# Patient Record
Sex: Male | Born: 1950 | Race: White | Hispanic: No | Marital: Married | Smoking: Never smoker
Health system: Southern US, Community
[De-identification: ages and names within clinical notes are randomized; demographics above are authoritative.]

## PROBLEM LIST (undated history)

## (undated) DIAGNOSIS — G43909 Migraine, unspecified, not intractable, without status migrainosus: Secondary | ICD-10-CM

## (undated) DIAGNOSIS — H409 Unspecified glaucoma: Secondary | ICD-10-CM

## (undated) DIAGNOSIS — R55 Syncope and collapse: Principal | ICD-10-CM

## (undated) DIAGNOSIS — G459 Transient cerebral ischemic attack, unspecified: Secondary | ICD-10-CM

## (undated) HISTORY — DX: Migraine, unspecified, not intractable, without status migrainosus: G43.909

## (undated) HISTORY — DX: Syncope and collapse: R55

---

## 1982-12-23 HISTORY — PX: APPENDECTOMY: SHX54

## 2004-08-09 ENCOUNTER — Ambulatory Visit (HOSPITAL_COMMUNITY): Admission: RE | Admit: 2004-08-09 | Discharge: 2004-08-09 | Payer: Self-pay | Admitting: Gastroenterology

## 2004-08-09 ENCOUNTER — Encounter (INDEPENDENT_AMBULATORY_CARE_PROVIDER_SITE_OTHER): Payer: Self-pay | Admitting: Specialist

## 2006-08-12 ENCOUNTER — Ambulatory Visit (HOSPITAL_COMMUNITY): Admission: RE | Admit: 2006-08-12 | Discharge: 2006-08-12 | Payer: Self-pay | Admitting: General Surgery

## 2006-12-23 HISTORY — PX: WRIST SURGERY: SHX841

## 2008-02-17 ENCOUNTER — Encounter (INDEPENDENT_AMBULATORY_CARE_PROVIDER_SITE_OTHER): Payer: Self-pay | Admitting: Orthopedic Surgery

## 2008-02-17 ENCOUNTER — Ambulatory Visit (HOSPITAL_BASED_OUTPATIENT_CLINIC_OR_DEPARTMENT_OTHER): Admission: RE | Admit: 2008-02-17 | Discharge: 2008-02-17 | Payer: Self-pay | Admitting: Orthopedic Surgery

## 2011-03-04 ENCOUNTER — Emergency Department (HOSPITAL_COMMUNITY): Payer: 59

## 2011-03-04 ENCOUNTER — Emergency Department (HOSPITAL_COMMUNITY)
Admission: EM | Admit: 2011-03-04 | Discharge: 2011-03-05 | Disposition: A | Discharge status: Home / Self Care | Payer: 59 | Attending: Emergency Medicine | Admitting: Emergency Medicine

## 2011-03-04 DIAGNOSIS — R51 Headache: Secondary | ICD-10-CM | POA: Insufficient documentation

## 2011-03-04 DIAGNOSIS — G459 Transient cerebral ischemic attack, unspecified: Secondary | ICD-10-CM

## 2011-03-04 DIAGNOSIS — F29 Unspecified psychosis not due to a substance or known physiological condition: Secondary | ICD-10-CM | POA: Insufficient documentation

## 2011-03-04 DIAGNOSIS — H409 Unspecified glaucoma: Secondary | ICD-10-CM | POA: Insufficient documentation

## 2011-03-04 DIAGNOSIS — R4701 Aphasia: Secondary | ICD-10-CM | POA: Insufficient documentation

## 2011-03-04 DIAGNOSIS — I059 Rheumatic mitral valve disease, unspecified: Secondary | ICD-10-CM | POA: Insufficient documentation

## 2011-03-04 DIAGNOSIS — R079 Chest pain, unspecified: Secondary | ICD-10-CM | POA: Insufficient documentation

## 2011-03-04 DIAGNOSIS — G9389 Other specified disorders of brain: Secondary | ICD-10-CM | POA: Insufficient documentation

## 2011-03-04 DIAGNOSIS — I079 Rheumatic tricuspid valve disease, unspecified: Secondary | ICD-10-CM | POA: Insufficient documentation

## 2011-03-04 LAB — DIFFERENTIAL
Basophils Absolute: 0 10*3/uL (ref 0.0–0.1)
Basophils Relative: 0 % (ref 0–1)
Eosinophils Relative: 3 % (ref 0–5)
Lymphocytes Relative: 19 % (ref 12–46)
Neutro Abs: 3.2 10*3/uL (ref 1.7–7.7)

## 2011-03-04 LAB — URINALYSIS, ROUTINE W REFLEX MICROSCOPIC
Glucose, UA: NEGATIVE mg/dL
Ketones, ur: NEGATIVE mg/dL
Nitrite: NEGATIVE
Protein, ur: NEGATIVE mg/dL
Urobilinogen, UA: 0.2 mg/dL (ref 0.0–1.0)

## 2011-03-04 LAB — CBC
HCT: 42.9 % (ref 39.0–52.0)
Hemoglobin: 15.2 g/dL (ref 13.0–17.0)
RDW: 13 % (ref 11.5–15.5)
WBC: 5.1 10*3/uL (ref 4.0–10.5)

## 2011-03-04 LAB — CK TOTAL AND CKMB (NOT AT ARMC)
CK, MB: 3.3 ng/mL (ref 0.3–4.0)
Relative Index: 2 (ref 0.0–2.5)
Total CK: 164 U/L (ref 7–232)

## 2011-03-04 LAB — RAPID URINE DRUG SCREEN, HOSP PERFORMED: Barbiturates: NOT DETECTED

## 2011-03-04 LAB — COMPREHENSIVE METABOLIC PANEL
ALT: 37 U/L (ref 0–53)
Calcium: 8.7 mg/dL (ref 8.4–10.5)
Creatinine, Ser: 0.89 mg/dL (ref 0.4–1.5)
GFR calc non Af Amer: >60 mL/min (ref >60)
Glucose, Bld: 113 mg/dL — ABNORMAL HIGH (ref 70–99)
Sodium: 137 mEq/L (ref 135–145)
Total Protein: 6.6 g/dL (ref 6.0–8.3)

## 2011-03-04 LAB — PROTIME-INR
INR: 1 (ref 0.00–1.49)
Prothrombin Time: 13.4 seconds (ref 11.6–15.2)

## 2011-03-04 LAB — POCT CARDIAC MARKERS
CKMB, poc: 2.1 ng/mL (ref 1.0–8.0)
Myoglobin, poc: 110 ng/mL (ref 12–200)

## 2011-03-04 LAB — APTT: aPTT: 34 seconds (ref 24–37)

## 2011-03-05 LAB — HEMOGLOBIN A1C
Hgb A1c MFr Bld: 5.6 % (ref <5.7)
Mean Plasma Glucose: 114 mg/dL (ref <117)

## 2011-03-26 NOTE — Consult Note (Signed)
NAME:  Jeremy Hayes, DISANO NO.:  [id number]  MEDICAL RECORD NO.:  [id number]           PATIENT TYPE:  LOCATION:                                 FACILITY:  PHYSICIAN:  Marlan Palau, M.D.  DATE OF BIRTH:  06/30/1951  DATE OF CONSULTATION:  03/04/2011 DATE OF DISCHARGE:                                CONSULTATION   HISTORY OF PRESENT ILLNESS:  Jeremy Hayes is a 60 year old right- handed white male born on [birth year], with a history of hemochromatosis.  This patient comes to [facility] after noting onset at 10:30 a.m. today of a mild confusional state.  This patient claims he woke up with a headache and has had a headache all day long that has been very mild in nature.  The patient noted that he felt confused and had some difficulty with slow deliberate speech for about 1 hour.  The patient had meetings but cannot recall what was going on during the meetings.  The patient reported no focal visual changes or numbness or weakness on the face, arms, or legs, and has never had events such as this previously.  The patient came to the emergency room but the confusional state had cleared.  The patient has undergone a TIA workup that has included MRI of the brain, MRA of the head, and a 2-D echocardiogram.  These studies were unremarkable.  This patient has undergone an EKG study that was normal, heart rate was 66.  The patient has NIH stroke scale score of zero at this time.  Carotid Doppler study was also done and was unremarkable.  The patient was already on low-dose aspirin prior to his arrival at the emergency room.  Neurology was asked to see this patient for further evaluation.  Past medical history is significant for: 1. Transient confusional state, TIA versus migrainous event. 2. Hemochromatosis. 3. Glaucoma. 4. Hernia surgery. 5. Appendectomy. 6. Left wrist fracture requiring surgery.  The patient was on aspirin 81 mg daily, chondroitin  sulfate for shoulder arthritis.  The patient takes latanoprost 0.005% one drop both eyes at night.  The patient does not smoke cigarettes, drinks alcohol on occasion, has no known allergies.  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  The patient has two children who are alive and well.  Family medical history notes that mother is still living in her 84s and is in excellent health.  Father died with complications of COPD.  Father had a history of bladder cancer and mother had a history of stomach cancer.  There was no migraine headache in the family.  The patient has three brothers who are alive and well.  The patient has no sisters.  REVIEW OF SYSTEMS:  Notable for no recent fevers, chills.  The patient did note a headache today.  Denies neck pain, back pain.  Denies shortness of breath, chest pains, abdominal pain, palpitation of the heart.  Denies nausea, vomiting, troubles controlling the bowels or bladder.  The patient denies any seizure events or blackout episodes.  PHYSICAL  EXAMINATION:  VITALS:  Blood pressure is 135/82, heart rate 65, respiratory rate 20, temperature afebrile. GENERAL:  The patient is a fairly well-developed white male who is alert and cooperative at the time of examination. HEENT:  Atraumatic.  Eyes, pupils are equal, round, reactive to light. Discs are flat bilaterally. NECK:  Supple.  No carotid bruits noted. RESPIRATORY:  Clear. CARDIOVASCULAR:  Regular rate and rhythm.  No obvious murmurs or rubs noted. EXTREMITIES:  Without significant edema. NEUROLOGIC:  Cranial nerves as above.  Facial symmetry is present.  The patient has good sensation of face to pinprick, soft touch bilaterally. He has good strength of the facial muscle, muscle of the head turn and shoulder shrug bilaterally.  Speech is well enunciated, not aphasic. Motor testing shows 5/5 strength  in all fours.  Good symmetric motor tone is noted throughout.  Sensory testing is intact to pinprick, soft touch, vibratory sensation throughout.  The patient has good finger-nose- finger and heel-to-shin bilaterally.  Gait was not tested.  Deep tendon reflexes symmetric, normal.  Toes are downgoing bilaterally.  No drift is seen in the arms or legs.  The patient has no extinction to double simultaneous stimulation bilaterally.  NIH stroke scale score is zero.  Laboratory values notable for white count of 5.1, hemoglobin 15.2, hematocrit of 42.9, MCV of 89.0, platelets of 158,000, INR of 1.00. Sodium 137, potassium 4.6, chloride of 104, CO2 26, glucose of 113, BUN of 19, creatinine 0.89.  Total bili 1.5, alk phosphatase is 64, SGOT of 35, SGPT of 37.  Total protein 6.6, albumin of 4.1, calcium 8.7.  CK of 164, troponin-I of 0.03.  Urine drug screen is unremarkable.  Urinalysis reveals specific gravity of 1.021, pH 7.0, otherwise unremarkable.  Once again, MRI of the brain is normal.  MRA of the head shows no stenosis of the larger medium-sized vessels.  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  IMPRESSION:  Transient confusional episode, etiology unclear.  This patient has had an event that is somewhat unusual for TIA.  The patient did report a headache throughout the day and could have had a migraine type event.  The patient is to continue low-dose aspirin and I will set this patient up for an EEG study as an outpatient.  The patient will follow up in the next 2 months or so.  The patient will be discharged to home tonight.     Marlan Palau, M.D.     CKW/MEDQ  D:  03/04/2011  T:  03/05/2011  Job:  [id number]  cc:   Emeterio Reeve, MD [facility].  Electronically Signed by Thana Farr M.D. on 03/26/2011 08:29:48 AM

## 2011-05-07 NOTE — Op Note (Signed)
NAME:  Jeremy Hayes, Jeremy Hayes             ACCOUNT NO.:  [id number]   MEDICAL RECORD NO.:  [id number]          PATIENT TYPE:  AMB   LOCATION:  DSC                          FACILITY:  [facility]   PHYSICIAN:  Cindee Salt, M.D.       DATE OF BIRTH:  April 14, 1951   DATE OF PROCEDURE:  02/17/2008  DATE OF DISCHARGE:                               OPERATIVE REPORT   PREOPERATIVE DIAGNOSIS:  Ulnocarpal abutment cyst, right wrist.   POSTOPERATIVE DIAGNOSIS:  Ulnocarpal abutment cyst, right wrist.   OPERATION:  1. Arthroscopy with debridement of TFCC;  2. Felden arthroplasty.  3. Debridement of proximal hamate.  4. Open excision of cyst, right wrist.   SURGEON:  Cindee Salt, M.D.   ASSISTANT:  Carolyne Fiscal, RN.   ANESTHESIA:  Supraclavicular block and general.   ANESTHESIOLOGIST:  Burna Forts, M.D.   HISTORY:  The patient is a 60 year old male with a history of pain and a  mass on the ulnar aspect of his left wrist.  He has had an MRI done,  revealing a TFCC tear.  He does have a long ulna, as compared radius;  with symptoms of ulnocarpal abutment.  He is desirous of having this  removed, with debridement of the joint as dictated by findings.  He is  aware of the risks and complications,  including:  infection, recurrence  of injury to arteries, nerves, tendons, incomplete relief of symptoms  and dystrophy; the possibility of further surgical intervention,  including formal ulnar shortening osteotomy.  In the preoperative area  the patient is seen; questions encouraged and answered.  Antibiotic  given.  The extremity marked by both the patient and surgeon.   PROCEDURE:  The patient was brought to the operating room, where a  supraclavicular block along with general anesthetic was carried out  without difficulty.  He was prepped using DuraPrep in the supine  position, with right arm free.  The limb was placed in the arthroscopy  tower; 10 pounds of traction applied.  The joint inflated to a 3-4  portal.  A transverse incision was made and deepened with a hemostat.  Blunt trocar was used to enter the joint.  Care was taken to protect the  EPL tendon.  The arthroscope was introduced into the joint.  The joint  was inspected.  An abrasion on the scapholunate ligament was noted,  along with synovitis on the radial aspect.  The articular surface showed  no significant change.  A lunotriquetral tear was noted, with  significant frond formation.  A TFCC tear was also noted floating in the  joint.  An irrigation catheter was placed in 6R portal, in that the cyst  was over 6U.  A 4-5 portal was opened after localization with a 22-gauge  needle.  This was deepened with a hemostat; blunt trocar was used to  enter the joint through a transverse incision through skin only.  The  joint was inspected from the ulnar side.  A partial tear of the  lunotriquetral joint was noted.  The pisiform was able to be visualized  from a pisiform recess, with  no significant articular damage.  The  midcarpal joint was then inspected; a type 2 lunate was noted, with  significant erosion on the dorsal aspect of the lunate, and a  significant erosion on the proximal and dorsal aspect of the hamate.  The STT proximal capitate remainder of the joint showed no changes.  A  synovitis was present.  This was done to the midcarpal radial portal  after localization with a 22 gauge needle.  The ulnar portal was opened.  ArthroWand used, with adequate flow to do a synovectomy.  A full radius  shaver was then introduced and a debridement performed of any loose  articular cartilage.  The proximal hamate was then burred with a bur.  This removed any compression between the lunate and the hamate.  The  scope was reintroduced into the proximal radial portal.  A debridement  of the triangle fibrocartilage complex was then performed with an  ArthroWand and a full radius shaver.  The full radius shaver was then  used to remove the  articular surface from the ulnar head.  A bur was  introduced, removing some bone distally on the ulnar head into  subchondral bone to the depth of the shaver.  X-rays confirmed adequate  decompression.  An injection of methylene blue was then performed into  the cyst, to attempt to see a stalk this was unsuccessful.  Both mid and  proximal carpal rows were inspected, as was the distal radioulnar joint;  no communication was noted.  The limb was not exsanguinated, but a  tourniquet inflated at this point to 250 mmHg to prevent bleeding and to  allow visualization.  Without any communication, a separate incision was  then made, after removing the arm from the arthroscopy tower over the  cyst, carried down through subcutaneous tissue.  The cyst was then  easily identified with the methylene blue and excised in toto and sent  to Pathology.  No communication to the joint was easily identified.  No  stalk was also easily identified.  The wound was irrigated.  The skin  was then closed with interrupted 5-0 Vicryl Rapide sutures.  A sterile  compressive dressing and splint was applied.  The patient tolerated the  procedure well and was taken to the recovery room for observation in  satisfactory condition.   He was discharged home; to return to the [facility] in 1  week, on Vicodin.           ______________________________  Cindee Salt, M.D.     GK/MEDQ  D:  02/17/2008  T:  02/18/2008  Job:  [id number]   cc:   Schuyler Amor, M.D.

## 2011-05-10 NOTE — Op Note (Signed)
NAME:  Jeremy Hayes, Jeremy Hayes NO.:  [id number]   MEDICAL RECORD NO.:  [id number]          PATIENT TYPE:  AMB   LOCATION:  SDS                          FACILITY:  [facility]   PHYSICIAN:  Cherylynn Ridges, M.D.    DATE OF BIRTH:  [birth year]   DATE OF PROCEDURE:  08/12/2006  DATE OF DISCHARGE:                                 OPERATIVE REPORT   PREOPERATIVE DIAGNOSIS:  Left inguinal hernia.   POSTOPERATIVE DIAGNOSIS:  Left indirect inguinal hernia.   PROCEDURE:  Left inguinal hernia repair with mesh.   SURGEON:  Cherylynn Ridges, M.D.   ASSISTANT:  None.   ANESTHESIA:  General endotracheal.   ESTIMATED BLOOD LOSS:  Less than 20 mL.   COMPLICATIONS:  None.   CONDITION:  Stable.   FINDINGS:  The patient had an indirect sac with small a weakness of the  floor.  There was no bowel contents in the sac.   INDICATIONS FOR OPERATION:  The patient is a 60 year old who had a  symptomatic left inguinal hernia and also a small right inguinal hernia that  was asymptomatic.  He now comes in for left inguinal hernia repair.   DESCRIPTION OF PROCEDURE:  The patient was taken to the operating room,  placed on the table in supine position.  After an adequate endotracheal  anesthetic was administered, he was prepped and draped in the usual sterile  manner exposing the left inguinal area.   A transverse curvilinear incision was made at the level of the superficial  ring and taken down to the fascia of the external oblique.  Hemostasis was  obtained with electrocautery.  Once we had isolated out the fascia, we  opened up the external oblique fascia through the superficial ring, exposing  the spermatic cord and ilioinguinal nerve.  We mobilized spermatic cord at  the pubic tubercle and lifted it up using Penrose drains, dissecting out the  cremasteric muscle.  I placed it up on a work bench in the wound and  dissected out the hernia sac that was in the anterior medial aspect of the  spermatic cord.  This was isolated away from the cord, taking care not to  injure the cord vessels and the vas deferens.  We ligated the cord at its  base after twisting it using two 0 Ethibond suture ligatures.  Once this was  done, the excess sac was removed.  We then reinforced the floor of the  inguinal canal using an oval piece of mesh measuring approximately 5 x 3 cm  in size, attaching it to the conjoined tendon anterior medially and the  reflected portion of the inguinal ligament inferolaterally using a running 0  Prolene suture.  Again care was taken not to infringe upon the ilioinguinal  nerve during the repair.  Once this was done, we irrigated the inguinal  floor and the area with antibiotic soap and solution, which the mesh had  been soaked prior to implantation.  Once this was done, we closed external  oblique fascia using a running 3-0 Vicryl suture.  Then Scarpa's fascia was  reapproximated using  three interrupted sutures of 3-0 Vicryl.  Then 0.50%  Marcaine without epi was injected into the wound, a total of 20 mL used.  The skin was closed using running subcuticular stitch of 4-0 Monocryl.  Sterile dressing was applied.  All counts were correct including needles,  sponges and instruments.      Cherylynn Ridges, M.D.  Electronically Signed     JOW/MEDQ  D:  08/12/2006  T:  08/12/2006  Job:  [id number]   cc:   Schuyler Amor, M.D.

## 2011-05-10 NOTE — Op Note (Signed)
NAME:  Jeremy Hayes, Jeremy Hayes NO.:  [id number]   MEDICAL RECORD NO.:  [id number]                   PATIENT TYPE:  AMB   LOCATION:  ENDO                                 FACILITY:  [facility]   PHYSICIAN:  Graylin Shiver, M.D.                DATE OF BIRTH:  01/28/1951   DATE OF PROCEDURE:  08/09/2004  DATE OF DISCHARGE:                                 OPERATIVE REPORT   PROCEDURE:  Colonoscopy with polypectomy.   ENDOSCOPIST:  Graylin Shiver, M.D.   INDICATIONS FOR PROCEDURE:  Screening.   Informed consent was obtained after explanation of the risks of bleeding,  infection and perforation.   PREMEDICATION:  Fentanyl 80 mcg IV and Versed 8 mg IV.   DESCRIPTION OF PROCEDURE:  With the patient in the left lateral decubitus  position, a rectal exam was performed and no masses were felt.  The Olympus  colonoscope was inserted into the rectum and advanced around the colon to  the cecum. The cecal landmarks were identified.  In the cecum, there was a  small 4 mm sessile polyp that was snared with the mini-snare and removed by  cold snare technique.  The polyp was retrieved.  The ascending colon looked  normal.  The transverse colon looked normal.  The descending colon, sigmoid  and rectum looked normal.  He tolerated the procedure well without  complications.   IMPRESSION:  Small cecal polyp (diagnosis code 211.3).   PLAN:  The pathology will be checked.                                               Graylin Shiver, M.D.    SFG/MEDQ  D:  08/09/2004  T:  08/09/2004  Job:  [id number]   cc:   Schuyler Amor, M.D.  [address]  [place], [place] [address]  Fax: [phone]

## 2014-03-10 ENCOUNTER — Emergency Department (HOSPITAL_BASED_OUTPATIENT_CLINIC_OR_DEPARTMENT_OTHER)
Admission: EM | Admit: 2014-03-10 | Discharge: 2014-03-10 | Disposition: A | Discharge status: Home / Self Care | Payer: 59 | Attending: Emergency Medicine | Admitting: Emergency Medicine

## 2014-03-10 ENCOUNTER — Encounter (HOSPITAL_BASED_OUTPATIENT_CLINIC_OR_DEPARTMENT_OTHER): Payer: Self-pay | Admitting: Emergency Medicine

## 2014-03-10 ENCOUNTER — Emergency Department (HOSPITAL_BASED_OUTPATIENT_CLINIC_OR_DEPARTMENT_OTHER): Payer: 59

## 2014-03-10 DIAGNOSIS — G459 Transient cerebral ischemic attack, unspecified: Secondary | ICD-10-CM | POA: Insufficient documentation

## 2014-03-10 DIAGNOSIS — R55 Syncope and collapse: Secondary | ICD-10-CM | POA: Insufficient documentation

## 2014-03-10 DIAGNOSIS — Z79899 Other long term (current) drug therapy: Secondary | ICD-10-CM | POA: Insufficient documentation

## 2014-03-10 DIAGNOSIS — Z7982 Long term (current) use of aspirin: Secondary | ICD-10-CM | POA: Insufficient documentation

## 2014-03-10 DIAGNOSIS — Z8669 Personal history of other diseases of the nervous system and sense organs: Secondary | ICD-10-CM | POA: Insufficient documentation

## 2014-03-10 HISTORY — DX: Unspecified glaucoma: H40.9

## 2014-03-10 HISTORY — DX: Transient cerebral ischemic attack, unspecified: G45.9

## 2014-03-10 LAB — BASIC METABOLIC PANEL
BUN: 24 mg/dL — ABNORMAL HIGH (ref 6–23)
CO2: 19 mEq/L (ref 19–32)
CREATININE: 0.9 mg/dL (ref 0.50–1.35)
Calcium: 8.7 mg/dL (ref 8.4–10.5)
Chloride: 102 mEq/L (ref 96–112)
GFR, EST NON AFRICAN AMERICAN: 89 mL/min — AB (ref >90)
Glucose, Bld: 166 mg/dL — ABNORMAL HIGH (ref 70–99)
POTASSIUM: 3.7 meq/L (ref 3.7–5.3)
Sodium: 138 mEq/L (ref 137–147)

## 2014-03-10 LAB — CBC WITH DIFFERENTIAL/PLATELET
BASOS ABS: 0 10*3/uL (ref 0.0–0.1)
Basophils Relative: 0 % (ref 0–1)
Eosinophils Absolute: 0 10*3/uL (ref 0.0–0.7)
Eosinophils Relative: 1 % (ref 0–5)
HEMATOCRIT: 43 % (ref 39.0–52.0)
HEMOGLOBIN: 14.8 g/dL (ref 13.0–17.0)
LYMPHS PCT: 29 % (ref 12–46)
Lymphs Abs: 1.4 10*3/uL (ref 0.7–4.0)
MCH: 31 pg (ref 26.0–34.0)
MCHC: 34.4 g/dL (ref 30.0–36.0)
MCV: 90.1 fL (ref 78.0–100.0)
MONOS PCT: 13 % — AB (ref 3–12)
Monocytes Absolute: 0.6 10*3/uL (ref 0.1–1.0)
NEUTROS ABS: 2.7 10*3/uL (ref 1.7–7.7)
Neutrophils Relative %: 57 % (ref 43–77)
Platelets: 110 10*3/uL — ABNORMAL LOW (ref 150–400)
RBC: 4.77 MIL/uL (ref 4.22–5.81)
RDW: 13.1 % (ref 11.5–15.5)
WBC: 4.7 10*3/uL (ref 4.0–10.5)

## 2014-03-10 LAB — TROPONIN I: Troponin I: <0.3 ng/mL (ref <0.30)

## 2014-03-10 LAB — D-DIMER, QUANTITATIVE: D-Dimer, Quant: <0.27 ug/mL-FEU (ref 0.00–0.48)

## 2014-03-10 LAB — PRO B NATRIURETIC PEPTIDE: Pro B Natriuretic peptide (BNP): 24.2 pg/mL (ref 0–125)

## 2014-03-10 MED ORDER — SODIUM CHLORIDE 0.9 % IV BOLUS (SEPSIS)
500.0000 mL | Freq: Once | INTRAVENOUS | Status: AC
  Administered 2014-03-10: 500 mL via INTRAVENOUS

## 2014-03-10 NOTE — ED Provider Notes (Signed)
CSN: [id number]     Arrival date & time 03/10/14  1344 History   First MD Initiated Contact with Patient 03/10/14 1400     Chief Complaint  Patient presents with  . Loss of Consciousness     (Consider location/radiation/quality/duration/timing/severity/associated sxs/prior Treatment) HPI Comments: Patient presents to the ER for evaluation after syncopal episode. Patient reports that he was at work when the episode occurred. He said he suddenly started to feel hot and flushed, then his vision got blurry. He noticed that he was feeling short of breath. He sat down at his desk and then passed out for a couple of minutes, according to coworker. No seizure-like activity noted. After patient awakened, at times resolved. He did have a brief episode of chest pain when he was coming to the ER, but is resolved. Patient reports that his breathing is back to normal. He has noticed a fine tremor in both of his hands, no weakness or loss of sensation. He does not have a headache. There was no injury.  Patient is a 63 y.o. male presenting with syncope.  Loss of Consciousness Associated symptoms: chest pain, diaphoresis and shortness of breath     Past Medical History  Diagnosis Date  . TIA (transient ischemic attack)   . Glaucoma    Past Surgical History  Procedure Laterality Date  . Appendectomy    . Wrist surgery     No family history on file. History  Substance Use Topics  . Smoking status: Not on file  . Smokeless tobacco: Not on file  . Alcohol Use: Not on file    Review of Systems  Constitutional: Positive for diaphoresis.  Respiratory: Positive for shortness of breath.   Cardiovascular: Positive for chest pain and syncope.  Neurological: Positive for tremors and syncope.  All other systems reviewed and are negative.      Allergies  Oxycodone  Home Medications   Current Outpatient Rx  Name  Route  Sig  Dispense  Refill  . aspirin 81 MG tablet   Oral   Take 81 mg by mouth  daily.         Marland Kitchen glucosamine-chondroitin 500-400 MG tablet   Oral   Take 1 tablet by mouth daily.         . Omega-3 Fatty Acids (FISH OIL PO)   Oral   Take by mouth.          BP 126/76  Pulse 71  Temp(Src) 97.4 F (36.3 C) (Oral)  Resp 18  Ht 6' (1.829 m)  Wt 195 lb (88.451 kg)  BMI 26.44 kg/m2  SpO2 100% Physical Exam  Constitutional: He is oriented to person, place, and time. He appears well-developed and well-nourished. No distress.  HENT:  Head: Normocephalic and atraumatic.  Right Ear: Hearing normal.  Left Ear: Hearing normal.  Nose: Nose normal.  Mouth/Throat: Oropharynx is clear and moist and mucous membranes are normal.  Eyes: Conjunctivae and EOM are normal. Pupils are equal, round, and reactive to light.  Neck: Normal range of motion. Neck supple.  Cardiovascular: Regular rhythm, S1 normal and S2 normal.  Exam reveals no gallop and no friction rub.   No murmur heard. Pulmonary/Chest: Effort normal and breath sounds normal. No respiratory distress. He exhibits no tenderness.  Abdominal: Soft. Normal appearance and bowel sounds are normal. There is no hepatosplenomegaly. There is no tenderness. There is no rebound, no guarding, no tenderness at McBurney's point and negative Murphy's sign. No hernia.  Musculoskeletal: Normal range  of motion.  Neurological: He is alert and oriented to person, place, and time. He has normal strength. He displays tremor (fine tremor in both hands and fingers). No cranial nerve deficit or sensory deficit. Coordination normal. GCS eye subscore is 4. GCS verbal subscore is 5. GCS motor subscore is 6.  Skin: Skin is warm, dry and intact. No rash noted. No cyanosis.  Psychiatric: He has a normal mood and affect. His speech is normal and behavior is normal. Thought content normal.    ED Course  Procedures (including critical care time) Labs Review Labs Reviewed  CBC WITH DIFFERENTIAL - Abnormal; Notable for the following:     Platelets 110 (*)    Monocytes Relative 13 (*)    All other components within normal limits  BASIC METABOLIC PANEL - Abnormal; Notable for the following:    Glucose, Bld 166 (*)    BUN 24 (*)    GFR calc non Af Amer 89 (*)    All other components within normal limits  PRO B NATRIURETIC PEPTIDE  TROPONIN I  D-DIMER, QUANTITATIVE   Imaging Review Dg Chest 2 View  03/10/2014   CLINICAL DATA:  Syncope, TIA  EXAM: CHEST  2 VIEW  COMPARISON:  03/04/2011  FINDINGS: Cardiomediastinal silhouette is stable. No acute infiltrate or pleural effusion. No pulmonary edema. Mild degenerative changes lower thoracic spine.  IMPRESSION: No active cardiopulmonary disease.   Electronically Signed   By: Natasha MeadLiviu  Pop M.D.   On: 03/10/2014 15:09   Ct Head Wo Contrast  03/10/2014   CLINICAL DATA:  TIA, syncope  EXAM: CT HEAD WITHOUT CONTRAST  TECHNIQUE: Contiguous axial images were obtained from the base of the skull through the vertex without intravenous contrast.  COMPARISON:  MR HEAD W/O CM dated 03/04/2011  FINDINGS: There is no evidence of mass effect, midline shift or extra-axial fluid collections. There is no evidence of a space-occupying lesion or intracranial hemorrhage. There is no evidence of a cortical-based area of acute infarction.  The ventricles and sulci are appropriate for the patient's age. The basal cisterns are patent.  Visualized portions of the orbits are unremarkable. The visualized portions of the paranasal sinuses and mastoid air cells are unremarkable.  The osseous structures are unremarkable.  IMPRESSION: Normal CT brain without intravenous contrast.   Electronically Signed   By: Elige KoHetal  Patel   On: 03/10/2014 15:00     EKG Interpretation   Date/Time:  Thursday March 10 2014 13:51:48 EDT Ventricular Rate:  72 PR Interval:  132 QRS Duration: 90 QT Interval:  446 QTC Calculation: 488 R Axis:   67 Text Interpretation:  Sinus rhythm with Premature atrial complexes  Nonspecific ST abnormality  Prolonged QT Abnormal ECG No significant change  since last tracing Confirmed by Keighley Deckman  MD, Cristal DeerHRISTOPHER ([id number]) on  03/10/2014 2:19:33 PM      MDM   Final diagnoses:  Syncope   Patient presented to the ER after a syncopal episode. Patient did have precursor symptoms of feeling hot, sweaty, followed by tunnel vision and brief loss of consciousness. The description of this episode does sound like a vasovagal episode, although there was no precipitating event. The patient did not have any pain prior to the event. He reported a slight touch of chest discomfort for brief minutes afterwards, but that spontaneously resolved. The patient's head CT was unremarkable. Patient's lab work was unremarkable including cardiac workup. D-dimer was reassuringly low.  At this point I recommended admission to the patient. I had extensive  conversations with he and his wife, who is an Charity fundraiser. It was explained to them that there could be undiscovered neurologic causes, such as stroke. Additionally I had a lengthy conversation about cardiac arrhythmia including bradycardia and V. tach. They understand my concerns, the patient does not wish to go to the hospital to simply be observed without any further testing. I did talk to him several times about this issue, each time stressing the need for hospitalization. He fully understands my concerns, but that she is to be discharged after a longer period of observation in the ER. He will followup with his primary care doctor as soon as possible. He agrees to return to the ER immediately if there are any further concerns or syncopal episodes.    Gilda Crease, MD 03/11/14 [phone]

## 2014-03-10 NOTE — ED Notes (Signed)
Co-worker reports pt came back from lunch, stated he didn't feel well and asked to call for an ambulance, sat in chair, turned "red, then white" and "passed out" for approximately 1-2 minutes.  Pt recalls not feeling well and vision was blurry. Headache started after episode.

## 2014-03-10 NOTE — Discharge Instructions (Signed)
Follow up with your doctor as soon as possible to arrange for further monitoring. Return to the ER immediately for any further episodes.  Cardiac Event Monitoring A cardiac event monitor is a small recording device used to help detect abnormal heart rhythms (arrhythmias). The monitor is used to record heart rhythm when noticeable symptoms such as the following occur:  Fast heart beats (palpitations), such as heart racing or fluttering.  Dizziness.  Fainting or lightheadedness.  Unexplained weakness. The monitor is wired to two electrodes placed on your chest. Electrodes are flat, sticky disks that attach to your skin. The monitor can be worn for up to 30 days. You will wear the monitor at all times, except when bathing.  HOW TO USE YOUR CARDIAC EVENT MONITOR A technician will prepare your chest for the electrode placement. The technician will show you how to place the electrodes, how to work the monitor, and how to replace the batteries. Take time to practice using the monitor before you leave the office. Make sure you understand how to send the information from the monitor to your health care provider. This requires a telephone with a landline, not a cellphone. You need to:  Wear your monitor at all times, except when you are in water:  Do not get the monitor wet.  Take the monitor off when bathing. Do not swim or use a hot tub with it on.  Keep your skin clean. Do not put body lotion or moisturizer on your chest.  Change the electrodes daily or any time they stop sticking to your skin. You might need to use tape to keep them on.  It is possible that your skin under the electrodes could become irritated. To keep this from happening, try to put the electrodes in slightly different places on your chest. However, they must remain in the area under your left breast and in the upper right section of your chest.  Make sure the monitor is safely clipped to your clothing or in a location close  to your body that your health care provider recommends.  Press the button to record when you feel symptoms of heart trouble, such as dizziness, weakness, lightheadedness, palpitations, thumping, shortness of breath, unexplained weakness, or a fluttering or racing heart. The monitor is always on and records what happened slightly before you pressed the button, so do not worry about being too late to get good information.  Keep a diary of your activities, such as walking, doing chores, and taking medicine. It is especially important to note what you were doing when you pushed the button to record your symptoms. This will help your health care provider determine what might be contributing to your symptoms. The information stored in your monitor will be reviewed by your health care provider alongside your diary entries.  Send the recorded information as recommended by your health care provider. It is important to understand that it will take some time for your health care provider to process the results.  Change the batteries as recommended by your health care provider. SEEK IMMEDIATE MEDICAL CARE IF:   You have chest pain.  You have extreme difficulty breathing or shortness of breath.  You develop a very fast heartbeat that persists.  You develop dizziness that does not go away .  You faint or constantly feel you are about to faint. Document Released: 09/17/2008 Document Revised: 08/11/2013 Document Reviewed: 06/07/2013 [facility] Patient Information 2014 [place], [place].  Syncope Syncope is a fainting spell. This means the  person loses consciousness and drops to the ground. The person is generally unconscious for less than 5 minutes. The person may have some muscle twitches for up to 15 seconds before waking up and returning to normal. Syncope occurs more often in elderly people, but it can happen to anyone. While most causes of syncope are not dangerous, syncope can be a sign of a serious  medical problem. It is important to seek medical care.  CAUSES  Syncope is caused by a sudden decrease in blood flow to the brain. The specific cause is often not determined. Factors that can trigger syncope include:  Taking medicines that lower blood pressure.  Sudden changes in posture, such as standing up suddenly.  Taking more medicine than prescribed.  Standing in one place for too long.  Seizure disorders.  Dehydration and excessive exposure to heat.  Low blood sugar (hypoglycemia).  Straining to have a bowel movement.  Heart disease, irregular heartbeat, or other circulatory problems.  Fear, emotional distress, seeing blood, or severe pain. SYMPTOMS  Right before fainting, you may:  Feel dizzy or lightheaded.  Feel nauseous.  See all white or all black in your field of vision.  Have cold, clammy skin. DIAGNOSIS  Your caregiver will ask about your symptoms, perform a physical exam, and perform electrocardiography (ECG) to record the electrical activity of your heart. Your caregiver may also perform other heart or blood tests to determine the cause of your syncope. TREATMENT  In most cases, no treatment is needed. Depending on the cause of your syncope, your caregiver may recommend changing or stopping some of your medicines. HOME CARE INSTRUCTIONS  Have someone stay with you until you feel stable.  Do not drive, operate machinery, or play sports until your caregiver says it is okay.  Keep all follow-up appointments as directed by your caregiver.  Lie down right away if you start feeling like you might faint. Breathe deeply and steadily. Wait until all the symptoms have passed.  Drink enough fluids to keep your urine clear or pale yellow.  If you are taking blood pressure or heart medicine, get up slowly, taking several minutes to sit and then stand. This can reduce dizziness. SEEK IMMEDIATE MEDICAL CARE IF:   You have a severe headache.  You have unusual  pain in the chest, abdomen, or back.  You are bleeding from the mouth or rectum, or you have black or tarry stool.  You have an irregular or very fast heartbeat.  You have pain with breathing.  You have repeated fainting or seizure-like jerking during an episode.  You faint when sitting or lying down.  You have confusion.  You have difficulty walking.  You have severe weakness.  You have vision problems. If you fainted, call your local emergency services (911 in [place] Do not drive yourself to the hospital.  MAKE SURE YOU:  Understand these instructions.  Will watch your condition.  Will get help right away if you are not doing well or get worse. Document Released: 12/09/2005 Document Revised: 06/09/2012 Document Reviewed: 02/07/2012 [facility] Patient Information 2014 [place].

## 2014-03-10 NOTE — ED Notes (Signed)
Syncopal episode while at work.

## 2014-03-10 NOTE — ED Notes (Signed)
Pt reports he wants to be discharged rather than admitted.  Dr. Jeraldine LootsLockwood informed and orders to d/c

## 2014-06-03 ENCOUNTER — Ambulatory Visit (INDEPENDENT_AMBULATORY_CARE_PROVIDER_SITE_OTHER): Payer: 59 | Admitting: Neurology

## 2014-06-03 ENCOUNTER — Telehealth: Payer: Self-pay | Admitting: Neurology

## 2014-06-03 ENCOUNTER — Encounter: Payer: Self-pay | Admitting: Neurology

## 2014-06-03 ENCOUNTER — Ambulatory Visit (INDEPENDENT_AMBULATORY_CARE_PROVIDER_SITE_OTHER): Payer: 59

## 2014-06-03 VITALS — BP 117/74 | HR 64 | Ht 74.0 in | Wt 198.0 lb

## 2014-06-03 DIAGNOSIS — R55 Syncope and collapse: Secondary | ICD-10-CM

## 2014-06-03 HISTORY — DX: Syncope and collapse: R55

## 2014-06-03 NOTE — Progress Notes (Signed)
Reason for visit: Syncope  Jeremy Hayes is a 63 y.o. male  History of present illness:  Jeremy Hayes is a 63 year old right-handed white male who comes to the office today for evaluation of the syncopal event that occurred on 03/10/2014. The patient was at work at that time, and he began developing some blurring of vision. Shortly thereafter, he noted that his cognitive processing was slow and he felt slightly confused. He called his wife, and the wife indicated that he needed to go to the emergency room. He went inside to get his coworkers, and he sat down in his chair, and subsequently lost consciousness for about 5 minutes. Prior to the black out, the patient was diaphoretic, and he was blanched in the face. The patient did not have any jerking, tongue biting, or bowel or bladder incontinence with this event. [facility], and he underwent a CT scan of the brain that was unremarkable. Blood work was unremarkable that included troponin I level. The patient underwent a full cardiac evaluation that included a stress test, EKG, carotid Doppler study, and a prolonged cardiac monitor for one month. This evaluation was within normal limits according to the patient. The patient is sent to this office for an evaluation. He has been seen through this office previously in 2012 for an event of mild headache and confusion. This was felt possibly to represent migraine headache. At that time, the patient underwent a full neurologic evaluation with MRI the brain, MRA of the head, carotid Doppler study, and 2-D echocardiogram. This entire workup was normal. The patient did have an EEG study at that time that showed some left-sided slowing. The patient indicated that there was no focal numbness or weakness of the face, arms, or legs with this most recent event. He denied any palpitations of the heart or chest pain. He had no initiating factors such as abdominal cramping or  abdominal pain. The patient reported no headache with the above event.  Past Medical History  Diagnosis Date  . TIA (transient ischemic attack)   . Glaucoma   . Migraine   . Syncope and collapse 06/03/2014    Past Surgical History  Procedure Laterality Date  . Appendectomy  1984  . 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 Cancer Mother     stomach cancer  . Cancer Father     father  . Cancer Brother     prostate  . Atrial fibrillation Brother     Social history:  reports that he has never smoked. He has never used smokeless tobacco. He reports that he drinks alcohol. He reports that he does not use illicit drugs.  Medications:  Current Outpatient Prescriptions on File Prior to Visit  Medication Sig Dispense Refill  . aspirin 81 MG tablet Take 81 mg by mouth daily.      Marland Kitchen. glucosamine-chondroitin 500-400 MG tablet Take 1 tablet by mouth daily.      . Omega-3 Fatty Acids (FISH OIL PO) Take by mouth.       No current facility-administered medications on file prior to visit.      Allergies  Allergen Reactions  . Oxycodone Nausea And Vomiting and Rash    ROS:  Out of a complete 14 system review of symptoms, the patient complains only of the following symptoms, and all other reviewed systems are negative.  Syncope  Blood pressure 117/74, pulse 64, height 6\' 2"  (1.88 m), weight 198 lb (89.812 kg).  Physical Exam  General: The patient is alert and cooperative at the time of the examination.  Eyes: Pupils are equal, round, and reactive to light. Discs are flat bilaterally.  Neck: The neck is supple, no carotid bruits are noted.  Respiratory: The respiratory examination is clear.  Cardiovascular: The cardiovascular examination reveals a regular rate and rhythm, no obvious murmurs or rubs are noted.  Skin: Extremities are without significant edema.  Neurologic Exam  Mental status: The patient is alert and  oriented x 3 at the time of the examination. The patient has apparent normal recent and remote memory, with an apparently normal attention span and concentration ability.  Cranial nerves: Facial symmetry is present. There is good sensation of the face to pinprick and soft touch bilaterally. The strength of the facial muscles and the muscles to head turning and shoulder shrug are normal bilaterally. Speech is well enunciated, no aphasia or dysarthria is noted. Extraocular movements are full. Visual fields are full. The tongue is midline, and the patient has symmetric elevation of the soft palate. No obvious hearing deficits are noted.  Motor: The motor testing reveals 5 over 5 strength of all 4 extremities. Good symmetric motor tone is noted throughout.  Sensory: Sensory testing is intact to pinprick, soft touch, vibration sensation, and position sense on all 4 extremities. No evidence of extinction is noted.  Coordination: Cerebellar testing reveals good finger-nose-finger and heel-to-shin bilaterally.  Gait and station: Gait is normal. Tandem gait is normal. Romberg is negative. No drift is seen.  Reflexes: Deep tendon reflexes are symmetric and normal bilaterally. Toes are downgoing bilaterally.   Assessment/Plan:  1. Vasovagal syncope  The etiology of the syncopal that is not clear. The patient had an event that is very consistent with a vasovagal syncopal episode, and a full cardiac workup was unremarkable. The patient will be sent for an EEG study, but if this is unremarkable, no further evaluation will be done. If the episode recurs, the patient is to lie down and let the episode pass. He will followup through this office if needed. The episode that occurred in 2012 appears to be somewhat different from what occurred in March of 2015.  Marlan Palau. Keith Zamaria Brazzle MD 06/03/2014 1:43 PM  [facility] [address] [place], [place] [id number]  Phone [phone] Fax  [phone]

## 2014-06-03 NOTE — Patient Instructions (Signed)
Syncope  Syncope is a fainting spell. This means the person loses consciousness and drops to the ground. The person is generally unconscious for less than 5 minutes. The person may have some muscle twitches for up to 15 seconds before waking up and returning to normal. Syncope occurs more often in elderly people, but it can happen to anyone. While most causes of syncope are not dangerous, syncope can be a sign of a serious medical problem. It is important to seek medical care.   CAUSES   Syncope is caused by a sudden decrease in blood flow to the brain. The specific cause is often not determined. Factors that can trigger syncope include:   Taking medicines that lower blood pressure.   Sudden changes in posture, such as standing up suddenly.   Taking more medicine than prescribed.   Standing in one place for too long.   Seizure disorders.   Dehydration and excessive exposure to heat.   Low blood sugar (hypoglycemia).   Straining to have a bowel movement.   Heart disease, irregular heartbeat, or other circulatory problems.   Fear, emotional distress, seeing blood, or severe pain.  SYMPTOMS   Right before fainting, you may:   Feel dizzy or lightheaded.   Feel nauseous.   See all white or all black in your field of vision.   Have cold, clammy skin.  DIAGNOSIS   Your caregiver will ask about your symptoms, perform a physical exam, and perform electrocardiography (ECG) to record the electrical activity of your heart. Your caregiver may also perform other heart or blood tests to determine the cause of your syncope.  TREATMENT   In most cases, no treatment is needed. Depending on the cause of your syncope, your caregiver may recommend changing or stopping some of your medicines.  HOME CARE INSTRUCTIONS   Have someone stay with you until you feel stable.   Do not drive, operate machinery, or play sports until your caregiver says it is okay.   Keep all follow-up appointments as directed by your  caregiver.   Lie down right away if you start feeling like you might faint. Breathe deeply and steadily. Wait until all the symptoms have passed.   Drink enough fluids to keep your urine clear or pale yellow.   If you are taking blood pressure or heart medicine, get up slowly, taking several minutes to sit and then stand. This can reduce dizziness.  SEEK IMMEDIATE MEDICAL CARE IF:    You have a severe headache.   You have unusual pain in the chest, abdomen, or back.   You are bleeding from the mouth or rectum, or you have black or tarry stool.   You have an irregular or very fast heartbeat.   You have pain with breathing.   You have repeated fainting or seizure-like jerking during an episode.   You faint when sitting or lying down.   You have confusion.   You have difficulty walking.   You have severe weakness.   You have vision problems.  If you fainted, call your local emergency services (911 in [place] Do not drive yourself to the hospital.   MAKE SURE YOU:   Understand these instructions.   Will watch your condition.   Will get help right away if you are not doing well or get worse.  Document Released: 12/09/2005 Document Revised: 06/09/2012 Document Reviewed: 02/07/2012  [employer] Patient Information 2014 [employer].

## 2014-06-03 NOTE — Procedures (Signed)
    History:  Jeremy MohairCharles Hayes is a 63 year old gentleman with a history of a syncopal event that occurred on 03/10/2014. This was associated with diaphoresis, blurred vision, and cognitive slowing. He is being evaluated for this event.  This is a routine EEG. No skull defects are noted. Medications include aspirin, Trusopt, Xalatan, Claritin, and fish oil.   EEG classification: Normal awake and drowsy  Description of the recording: The background rhythms of this recording consists of a fairly well modulated medium amplitude alpha rhythm of 9 Hz that is reactive to eye opening and closure. As the record progresses, the patient appears to remain in the waking state throughout the recording. Photic stimulation was performed, resulting in a bilateral and symmetric photic driving response. Hyperventilation was also performed, resulting in a minimal buildup of the background rhythm activities without significant slowing seen. Toward the end of the recording, the patient enters the drowsy state with slight symmetric slowing seen. The patient never enters stage II sleep. At no time during the recording does there appear to be evidence of spike or spike wave discharges or evidence of focal slowing. EKG monitor shows no evidence of cardiac rhythm abnormalities with a heart rate of 60.  Impression: This is a normal EEG recording in the waking and drowsy state. No evidence of ictal or interictal discharges are seen.

## 2014-06-03 NOTE — Telephone Encounter (Signed)
I called the patient. The EEG was normal.  

## 2014-11-09 ENCOUNTER — Encounter: Payer: Self-pay | Admitting: Neurology

## 2014-11-15 ENCOUNTER — Encounter: Payer: Self-pay | Admitting: Neurology

## 2014-12-12 ENCOUNTER — Other Ambulatory Visit: Payer: Self-pay | Admitting: Gastroenterology

## 2016-01-16 IMAGING — CT CT HEAD W/O CM
1 series · 16 of 30 positions shown, 20 images · non-contrast
Comparison: MR HEAD W/O CM dated 03/04/2011

CLINICAL DATA: TIA, syncope

EXAM:
CT HEAD WITHOUT CONTRAST
TECHNIQUE: Contiguous axial images were obtained from the base of the skull
through the vertex without intravenous contrast.

[Series 2: head 4.8 h37s · axial · 0.46mm/px · z∈[+1228,+1384]mm · 16 of 36 slices shown, 20 images]
[im 2/36  brain]
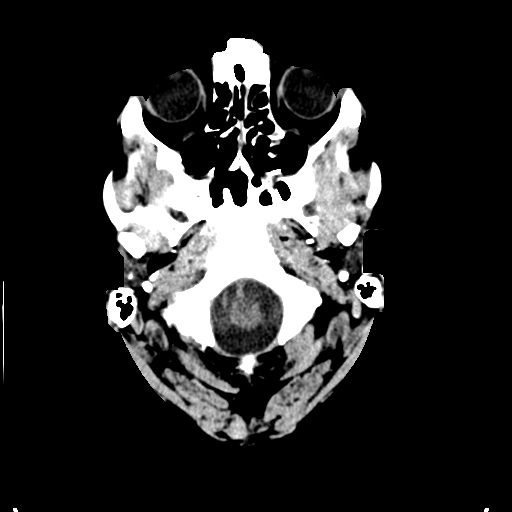
[im 2/36  bone]
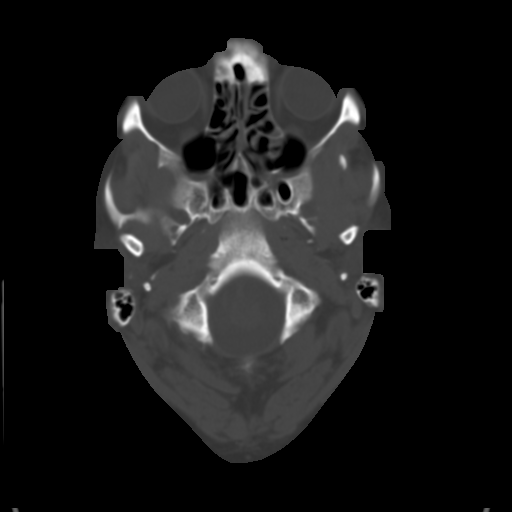
[im 4/36  brain]
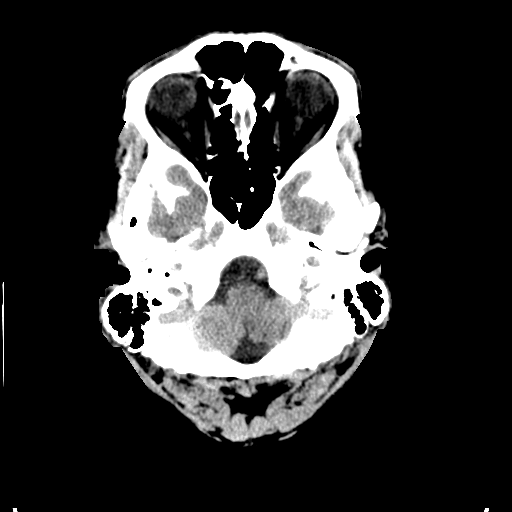
[im 7/36  brain]
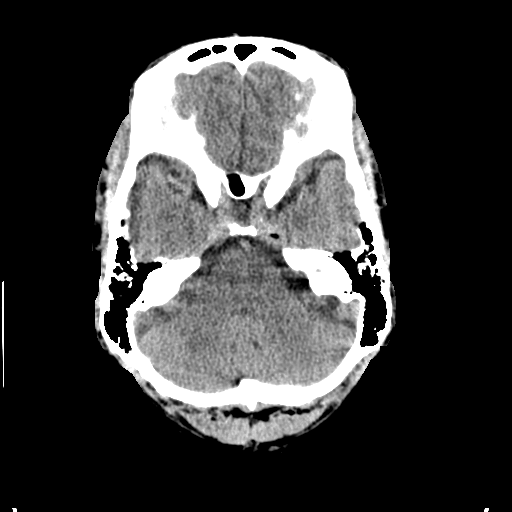
[im 9/36  brain]
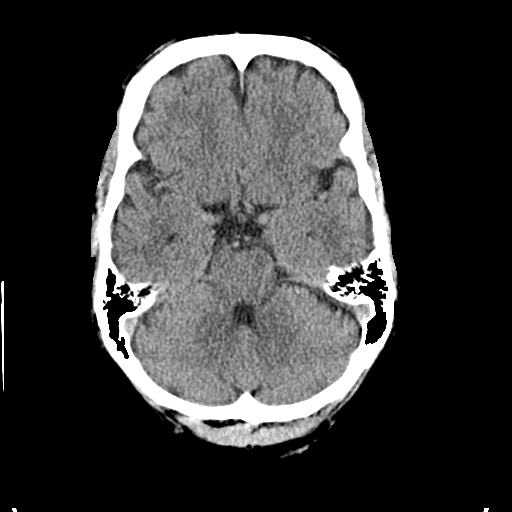
[im 10/36  brain]
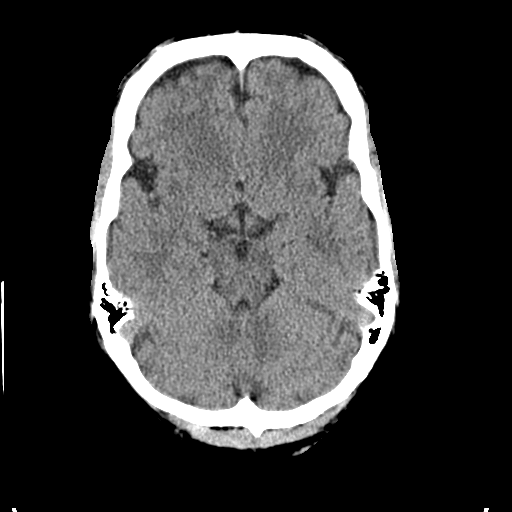
[im 10/36  bone]
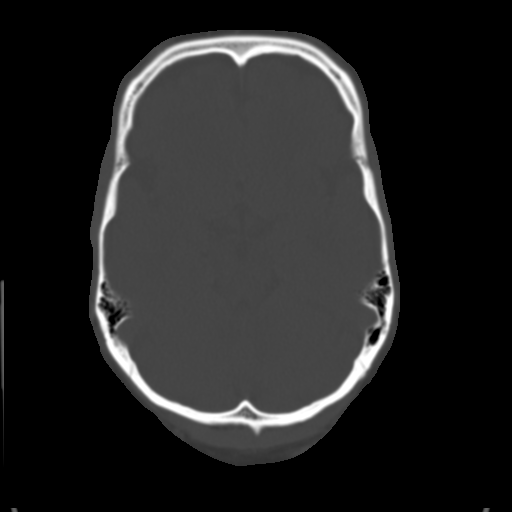
[im 13/36  brain]
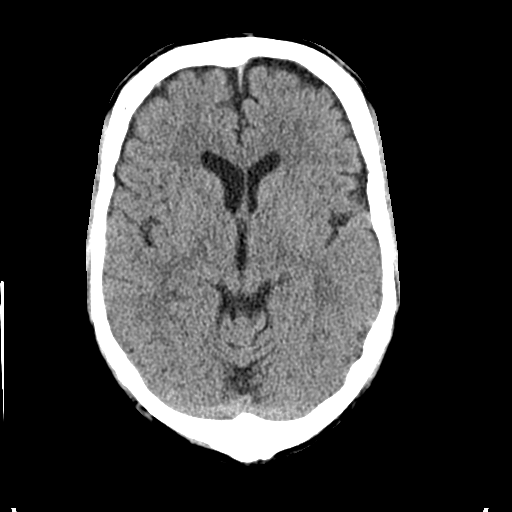
[im 15/36  brain]
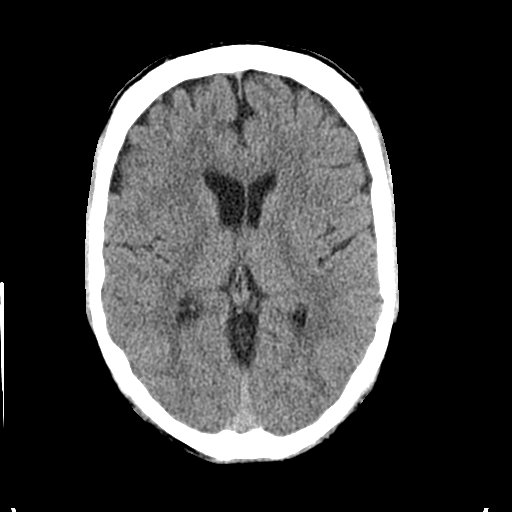
[im 17/36  brain]
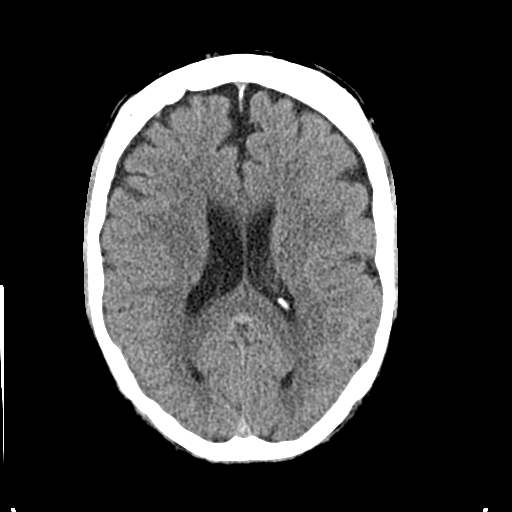
[im 19/36  brain]
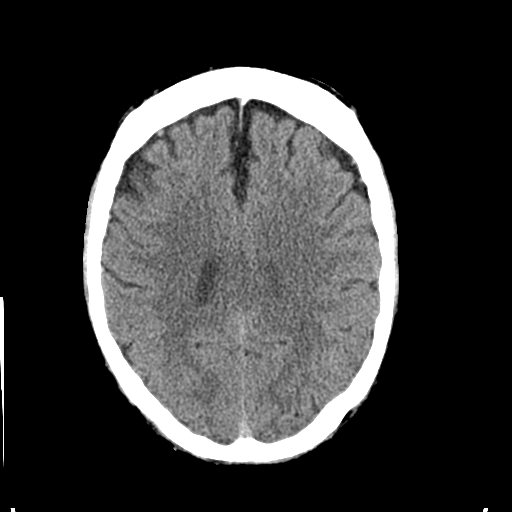
[im 19/36  bone]
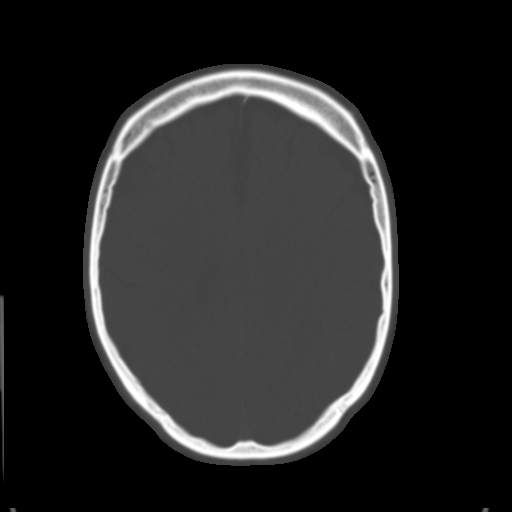
[im 21/36  brain]
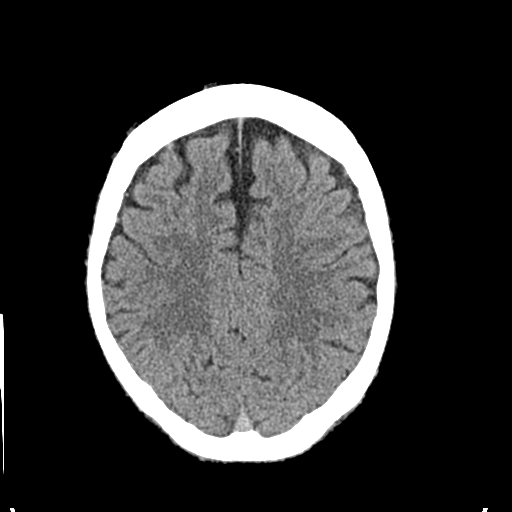
[im 23/36  brain]
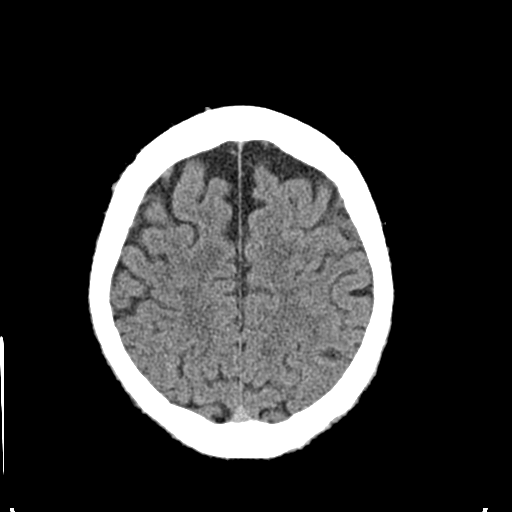
[im 26/36  brain]
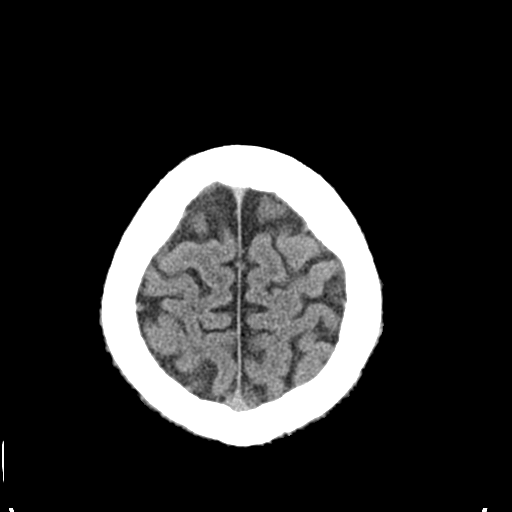
[im 27/36  brain]
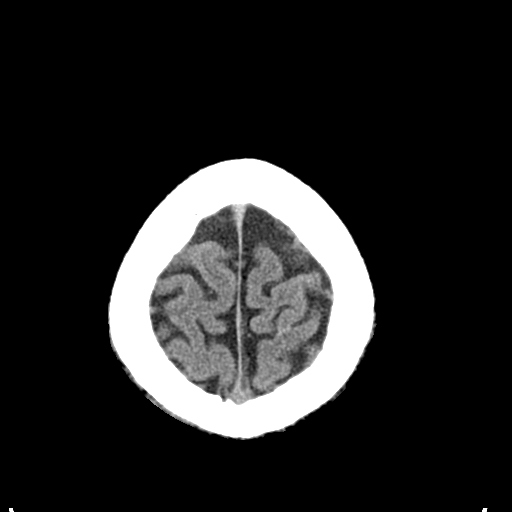
[im 27/36  bone]
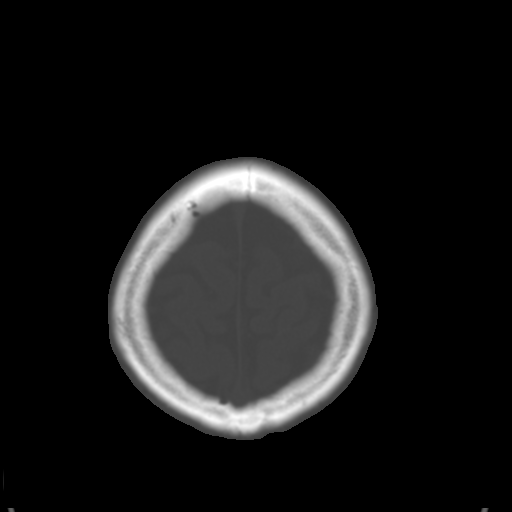
[im 29/36  brain]
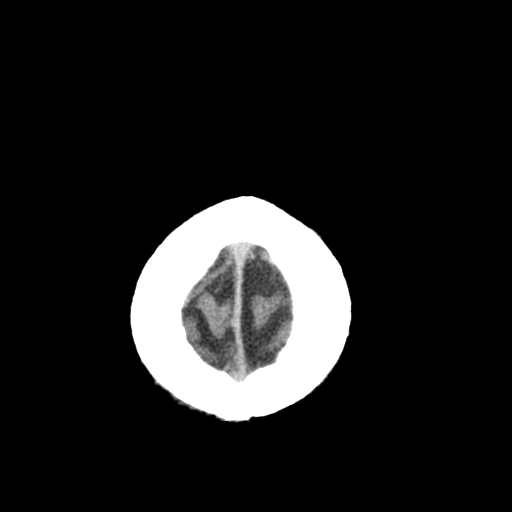
[im 32/36  brain]
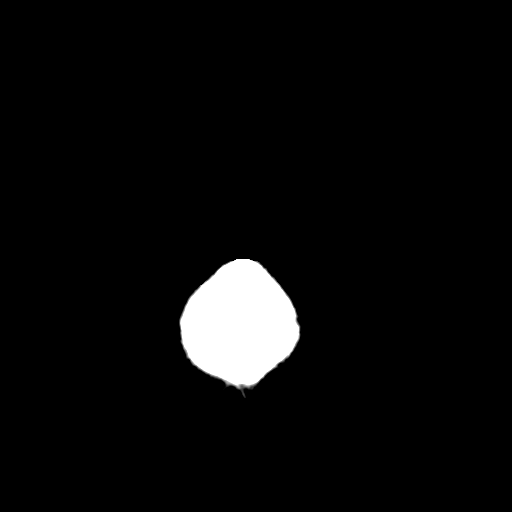
[im 34/36  brain]
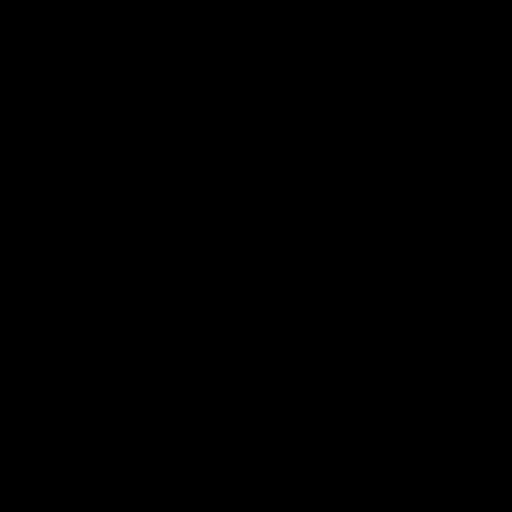

[16 of 30 positions shown; findings below may reference images not displayed]

FINDINGS: There is no evidence of mass effect, midline shift or extra-axial
fluid collections. There is no evidence of a space-occupying lesion
or intracranial hemorrhage. There is no evidence of a cortical-based
area of acute infarction.

The ventricles and sulci are appropriate for the patient's age. The
basal cisterns are patent.

Visualized portions of the orbits are unremarkable. The visualized
portions of the paranasal sinuses and mastoid air cells are
unremarkable.

The osseous structures are unremarkable.
IMPRESSION: Normal CT brain without intravenous contrast.
